# Patient Record
Sex: Male | Born: 1985 | Race: White | Hispanic: No | Marital: Single | Smoking: Never smoker
Health system: Southern US, Community
[De-identification: ages and names within clinical notes are randomized; demographics above are authoritative.]

---

## 2014-11-28 ENCOUNTER — Encounter (HOSPITAL_COMMUNITY): Payer: Self-pay | Admitting: *Deleted

## 2014-11-28 ENCOUNTER — Emergency Department (HOSPITAL_COMMUNITY): Payer: PRIVATE HEALTH INSURANCE

## 2014-11-28 ENCOUNTER — Emergency Department (HOSPITAL_COMMUNITY)
Admission: EM | Admit: 2014-11-28 | Discharge: 2014-11-28 | Disposition: A | Discharge status: Home / Self Care | Payer: PRIVATE HEALTH INSURANCE | Attending: Emergency Medicine | Admitting: Emergency Medicine

## 2014-11-28 DIAGNOSIS — N508 Other specified disorders of male genital organs: Secondary | ICD-10-CM | POA: Diagnosis present

## 2014-11-28 DIAGNOSIS — N451 Epididymitis: Secondary | ICD-10-CM | POA: Insufficient documentation

## 2014-11-28 DIAGNOSIS — N50819 Testicular pain, unspecified: Secondary | ICD-10-CM

## 2014-11-28 MED ORDER — DOXYCYCLINE HYCLATE 100 MG PO TABS
100.0000 mg | ORAL_TABLET | Freq: Once | ORAL | Status: AC
  Administered 2014-11-28: 100 mg via ORAL
  Filled 2014-11-28: qty 1

## 2014-11-28 MED ORDER — IBUPROFEN 800 MG PO TABS: 800.0000 mg | ORAL_TABLET | Freq: Three times a day (TID) | ORAL | Status: AC | PRN

## 2014-11-28 MED ORDER — LIDOCAINE HCL (PF) 1 % IJ SOLN
2.0000 mL | Freq: Once | INTRAMUSCULAR | Status: AC
  Administered 2014-11-28: 0.9 mL
  Filled 2014-11-28: qty 5

## 2014-11-28 MED ORDER — DOXYCYCLINE HYCLATE 100 MG PO CAPS: ORAL_CAPSULE | ORAL | Status: AC

## 2014-11-28 MED ORDER — CEFTRIAXONE SODIUM 250 MG IJ SOLR
250.0000 mg | Freq: Once | INTRAMUSCULAR | Status: AC
  Administered 2014-11-28: 250 mg via INTRAMUSCULAR
  Filled 2014-11-28: qty 250

## 2014-11-28 NOTE — Discharge Instructions (Signed)
Follow up with [facility] in [place] or  in 1-2 weeks

## 2014-11-28 NOTE — ED Provider Notes (Signed)
CSN: [id number]     Arrival date & time 11/28/14  1244 History   First MD Initiated Contact with Patient 11/28/14 1259     Chief Complaint  Patient presents with  . Testicle Pain     (Consider location/radiation/quality/duration/timing/severity/associated sxs/prior Treatment) Patient is a 28 y.o. male presenting with testicular pain. The history is provided by the patient (the pt complians of pain in the left testicle).  Testicle Pain This is a new problem. The current episode started more than 2 days ago. The problem occurs constantly. The problem has not changed since onset.Pertinent negatives include no chest pain, no abdominal pain and no headaches. Nothing aggravates the symptoms. Nothing relieves the symptoms.    History reviewed. No pertinent past medical history. History reviewed. No pertinent past surgical history. No family history on file. History  Substance Use Topics  . Smoking status: Never Smoker   . Smokeless tobacco: Not on file  . Alcohol Use: Yes     Comment: rarely    Review of Systems  Constitutional: Negative for appetite change and fatigue.  HENT: Negative for congestion, ear discharge and sinus pressure.   Eyes: Negative for discharge.  Respiratory: Negative for cough.   Cardiovascular: Negative for chest pain.  Gastrointestinal: Negative for abdominal pain and diarrhea.  Genitourinary: Positive for testicular pain. Negative for frequency and hematuria.  Musculoskeletal: Negative for back pain.  Skin: Negative for rash.  Neurological: Negative for seizures and headaches.  Psychiatric/Behavioral: Negative for hallucinations.      Allergies  Review of patient's allergies indicates no known allergies.  Home Medications   Prior to Admission medications   Medication Sig Start Date End Date Taking? Authorizing Provider  doxycycline (VIBRAMYCIN) 100 MG capsule One po bid 11/28/14   Bethann Berkshire, MD  ibuprofen (ADVIL,MOTRIN) 800 MG tablet Take 1 tablet  (800 mg total) by mouth every 8 (eight) hours as needed for moderate pain. 11/28/14   Bethann Berkshire, MD   BP 122/78 mmHg  Pulse 66  Temp(Src) 97.3 F (36.3 C) (Oral)  Resp 14  Ht  (1.88 m)  Wt 220 lb 7.4 oz (100 kg)  BMI 28.29 kg/m2  SpO2 100% Physical Exam  Constitutional: He is oriented to person, place, and time. He appears well-developed.  HENT:  Head: Normocephalic.  Eyes: Conjunctivae and EOM are normal. No scleral icterus.  Neck: Neck supple. No thyromegaly present.  Cardiovascular: Normal rate and regular rhythm.  Exam reveals no gallop and no friction rub.   No murmur heard. Pulmonary/Chest: No stridor. He has no wheezes. He has no rales. He exhibits no tenderness.  Abdominal: He exhibits no distension. There is no tenderness. There is no rebound.  Genitourinary:  Tender swollen left testicle  Musculoskeletal: Normal range of motion. He exhibits no edema.  Lymphadenopathy:    He has no cervical adenopathy.  Neurological: He is oriented to person, place, and time. He exhibits normal muscle tone. Coordination normal.  Skin: No rash noted. No erythema.  Psychiatric: He has a normal mood and affect. His behavior is normal.    ED Course  Procedures (including critical care time) Labs Review Labs Reviewed - No data to display  Imaging Review US Scrotum  11/28/2014   CLINICAL DATA:  LEFT testicular pain for 3 days.  Initial encounter.  EXAM: SCROTAL ULTRASOUND  DOPPLER ULTRASOUND OF THE TESTICLES  TECHNIQUE: Complete ultrasound examination of the testicles, epididymis, and other scrotal structures was performed. Color and spectral Doppler ultrasound were also utilized to  evaluate blood flow to the testicles.  COMPARISON:  None.  FINDINGS: Right testicle  Measurements: 37 mm x 18 mm x 23 mm. No mass or microlithiasis visualized.  Left testicle  Measurements: 43 mm x 23 mm x 33 mm. No mass or microlithiasis visualized.  Right epididymis:  Normal in size and appearance.  Left  epididymis: The LEFT epididymis is enlarged and hypervascular when compared to the adjacent testicle.  Hydrocele:  None.  Varicocele:  None visualized.  Pulsed Doppler interrogation of both testes demonstrates normal low resistance arterial and venous waveforms bilaterally.  IMPRESSION: LEFT epididymitis.   Electronically Signed   By: Andreas NewportGeoffrey  Lamke M.D.   On: 11/28/2014 14:25   [place] Art/ven Flow Abd Pelv Doppler  11/28/2014   CLINICAL DATA:  LEFT testicular pain for 3 days.  Initial encounter.  EXAM: SCROTAL ULTRASOUND  DOPPLER ULTRASOUND OF THE TESTICLES  TECHNIQUE: Complete ultrasound examination of the testicles, epididymis, and other scrotal structures was performed. Color and spectral Doppler ultrasound were also utilized to evaluate blood flow to the testicles.  COMPARISON:  None.  FINDINGS: Right testicle  Measurements: 37 mm x 18 mm x 23 mm. No mass or microlithiasis visualized.  Left testicle  Measurements: 43 mm x 23 mm x 33 mm. No mass or microlithiasis visualized.  Right epididymis:  Normal in size and appearance.  Left epididymis: The LEFT epididymis is enlarged and hypervascular when compared to the adjacent testicle.  Hydrocele:  None.  Varicocele:  None visualized.  Pulsed Doppler interrogation of both testes demonstrates normal low resistance arterial and venous waveforms bilaterally.  IMPRESSION: LEFT epididymitis.   Electronically Signed   By: Andreas NewportGeoffrey  Lamke M.D.   On: 11/28/2014 14:25     EKG Interpretation None      MDM   Final diagnoses:  Epididymitis    Epididymitis,   Treat with rocephin, doxy, motrin and follow up with urology     Bethann BerkshireJoseph Margaux Engen, MD 11/28/14 1526

## 2014-11-28 NOTE — ED Notes (Signed)
Pt states tenderness to left testicle first noticed on Friday. Pain became worse today. Pt also states intermittent stinging with urination which began last Sunday

## 2016-12-01 IMAGING — US US SCROTUM
1 series · 14 of 25 positions shown · non-contrast
Comparison: None.

CLINICAL DATA: LEFT testicular pain for 3 days.  Initial encounter.

EXAM:
SCROTAL ULTRASOUND
DOPPLER ULTRASOUND OF THE TESTICLES
TECHNIQUE: Complete ultrasound examination of the testicles, epididymis, and
other scrotal structures was performed. Color and spectral Doppler
ultrasound were also utilized to evaluate blood flow to the
testicles.

[Series 1: us scrotum · 0.05mm/px · 14 of 48 slices shown]
[im 1/48]
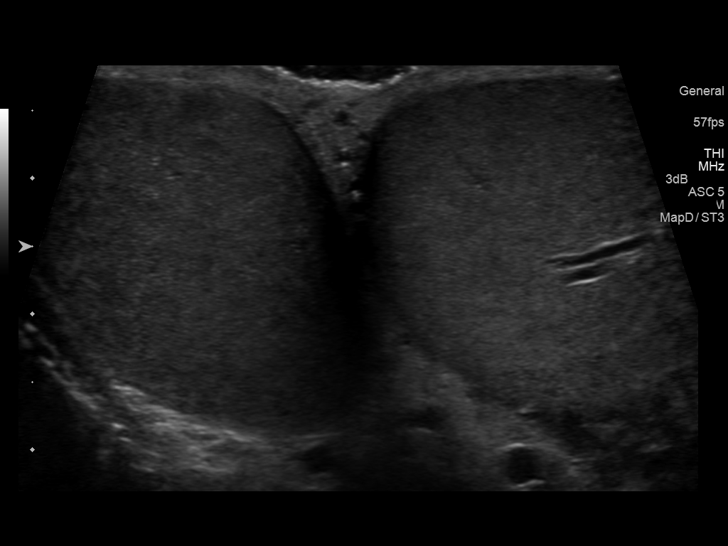
[im 4/48]
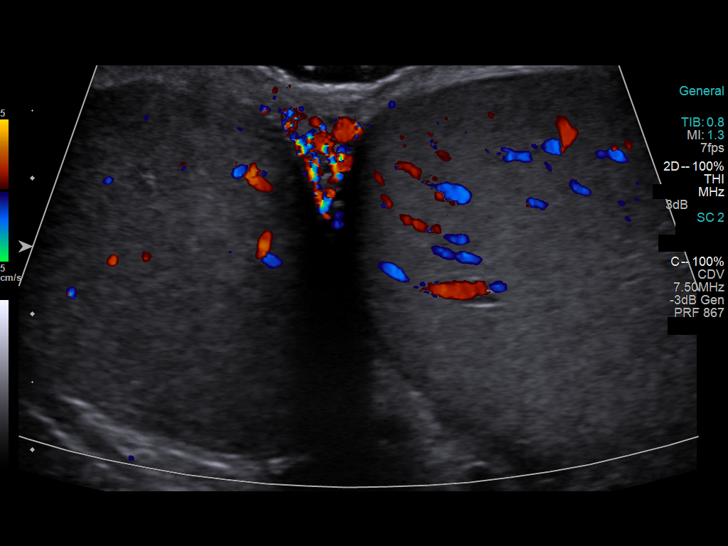
[im 8/48]
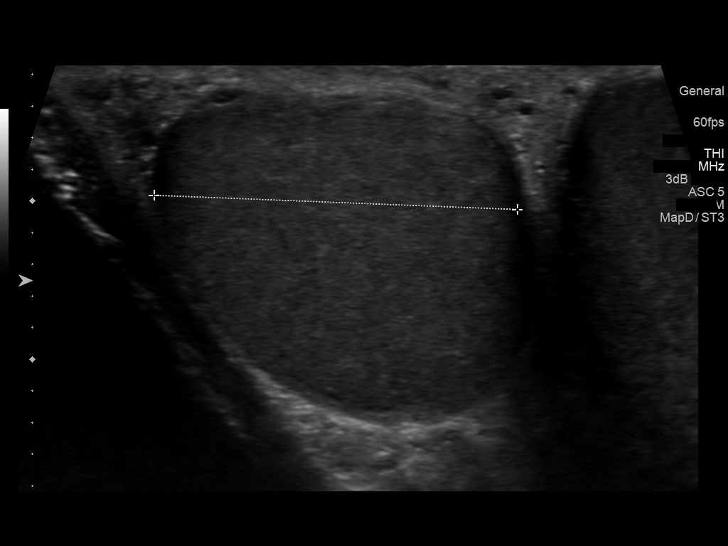
[im 12/48]
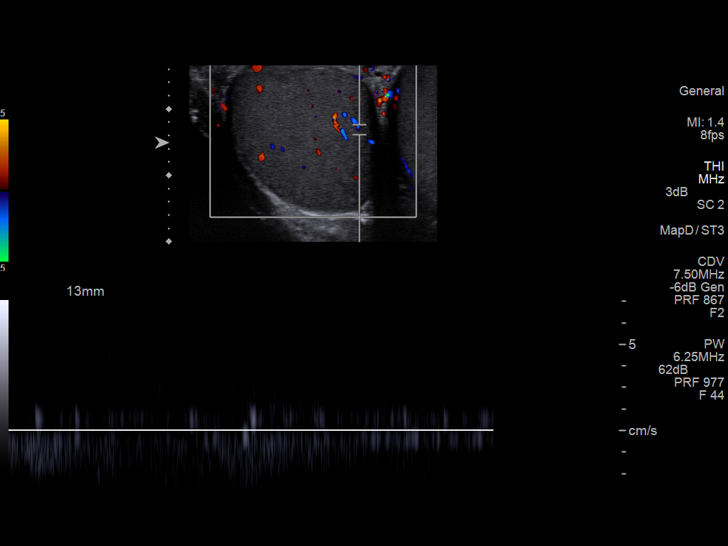
[im 16/48]
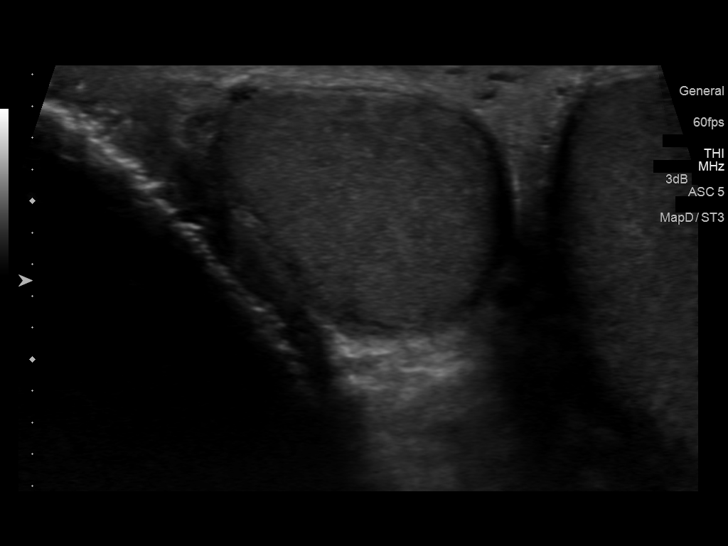
[im 18/48]
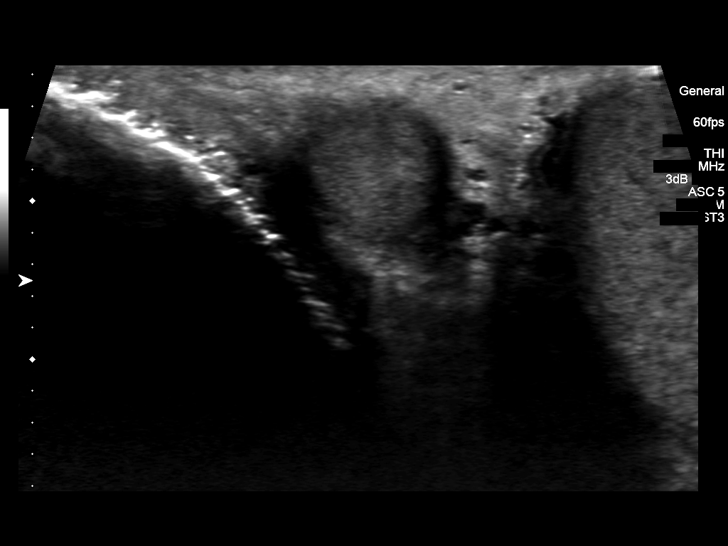
[im 22/48]
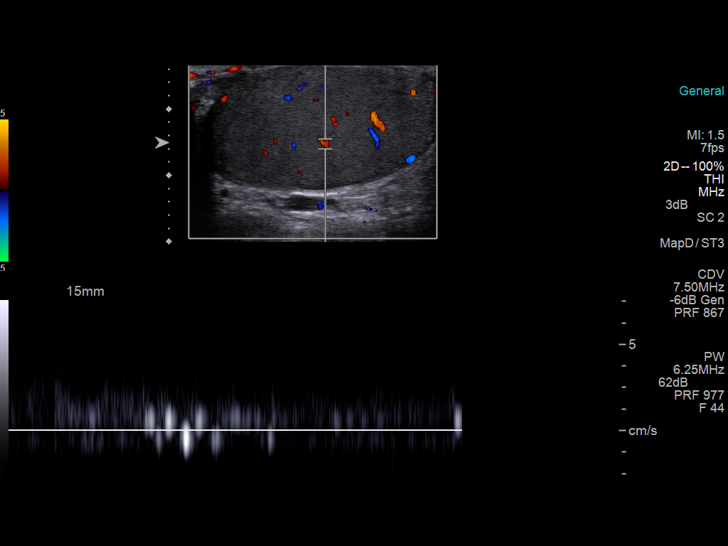
[im 26/48]
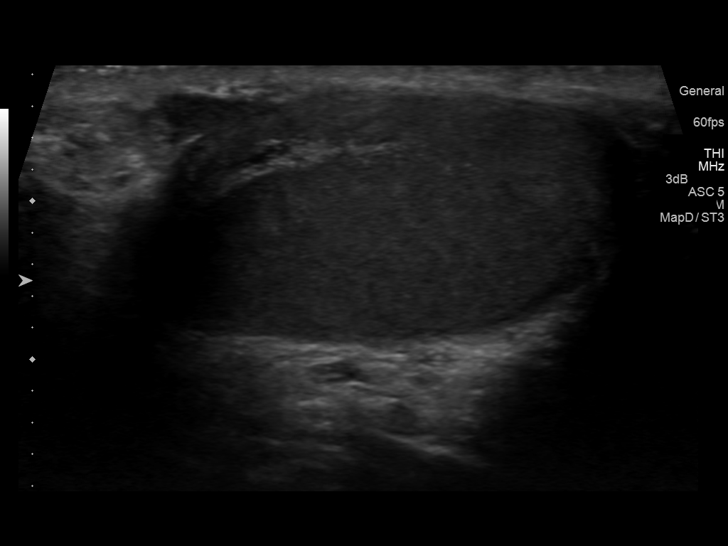
[im 30/48]
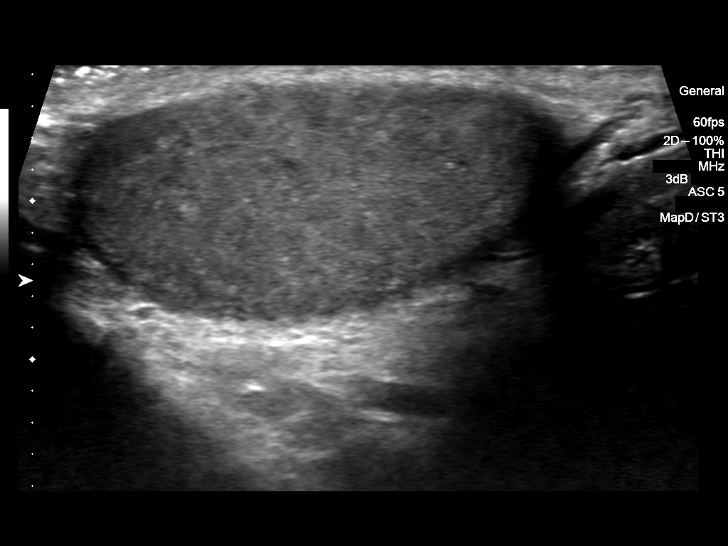
[im 32/48]
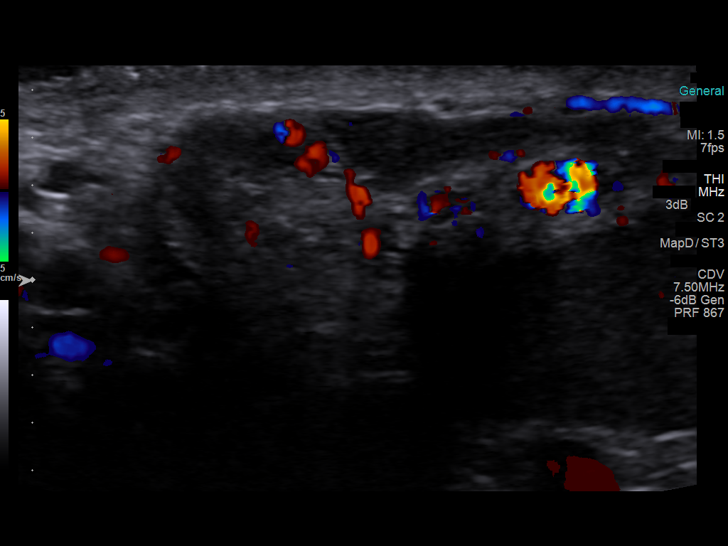
[im 36/48]
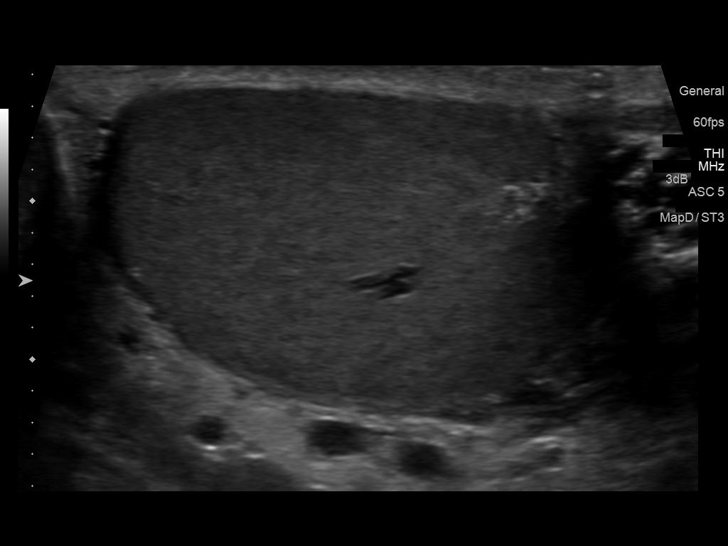
[im 40/48]
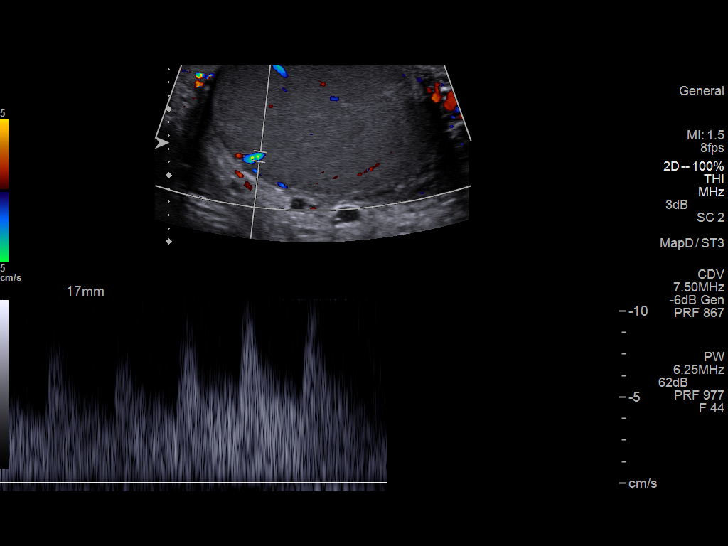
[im 44/48]
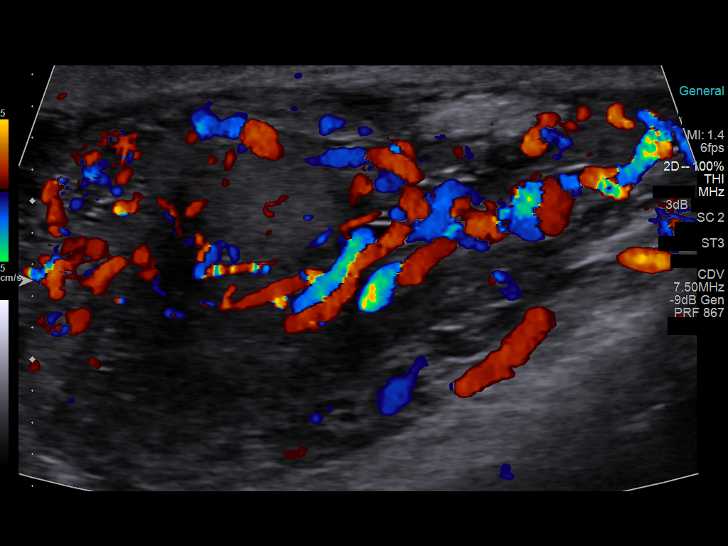
[im 48/48]
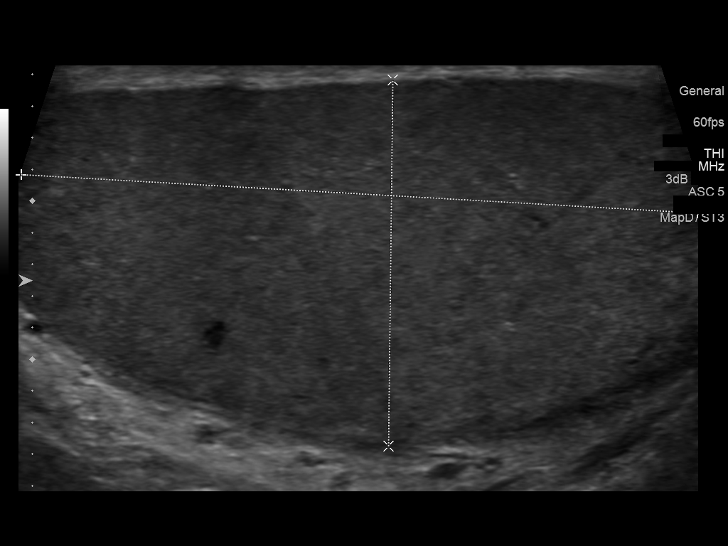

[14 of 25 positions shown; findings below may reference images not displayed]

FINDINGS: Right testicle

Measurements: 37 mm x 18 mm x 23 mm. No mass or microlithiasis
visualized.

Left testicle

Measurements: 43 mm x 23 mm x 33 mm. No mass or microlithiasis
visualized.

Right epididymis:  Normal in size and appearance.

Left epididymis: The LEFT epididymis is enlarged and hypervascular
when compared to the adjacent testicle.

Hydrocele:  None.

Varicocele:  None visualized.

Pulsed Doppler interrogation of both testes demonstrates normal low
resistance arterial and venous waveforms bilaterally.
IMPRESSION: LEFT epididymitis.
